# Patient Record
Sex: Male | Born: 1975 | Hispanic: No | Marital: Single | Smoking: Never smoker
Health system: Southern US, Community
[De-identification: ages and names within clinical notes are randomized; demographics above are authoritative.]

---

## 1999-06-28 ENCOUNTER — Emergency Department (HOSPITAL_COMMUNITY): Admission: EM | Admit: 1999-06-28 | Discharge: 1999-06-28 | Payer: Self-pay | Admitting: Emergency Medicine

## 2000-10-12 ENCOUNTER — Emergency Department (HOSPITAL_COMMUNITY): Admission: EM | Admit: 2000-10-12 | Discharge: 2000-10-12 | Payer: Self-pay | Admitting: Emergency Medicine

## 2008-11-20 ENCOUNTER — Emergency Department (HOSPITAL_COMMUNITY): Admission: EM | Admit: 2008-11-20 | Discharge: 2008-11-20 | Payer: Self-pay | Admitting: Emergency Medicine

## 2009-04-03 ENCOUNTER — Emergency Department (HOSPITAL_COMMUNITY): Admission: EM | Admit: 2009-04-03 | Discharge: 2009-04-03 | Payer: Self-pay | Admitting: Emergency Medicine

## 2009-08-18 ENCOUNTER — Emergency Department (HOSPITAL_COMMUNITY): Admission: EM | Admit: 2009-08-18 | Discharge: 2009-08-18 | Payer: Self-pay | Admitting: Family Medicine

## 2010-08-13 ENCOUNTER — Inpatient Hospital Stay (INDEPENDENT_AMBULATORY_CARE_PROVIDER_SITE_OTHER)
Admission: RE | Admit: 2010-08-13 | Discharge: 2010-08-13 | Disposition: A | Discharge status: Home / Self Care | Payer: Self-pay | Source: Ambulatory Visit | Attending: Family Medicine | Admitting: Family Medicine

## 2010-08-13 DIAGNOSIS — M76899 Other specified enthesopathies of unspecified lower limb, excluding foot: Secondary | ICD-10-CM

## 2010-08-13 DIAGNOSIS — M62838 Other muscle spasm: Secondary | ICD-10-CM

## 2010-09-01 ENCOUNTER — Inpatient Hospital Stay (INDEPENDENT_AMBULATORY_CARE_PROVIDER_SITE_OTHER)
Admission: RE | Admit: 2010-09-01 | Discharge: 2010-09-01 | Disposition: A | Discharge status: Home / Self Care | Payer: Self-pay | Source: Ambulatory Visit | Attending: Family Medicine | Admitting: Family Medicine

## 2010-09-01 DIAGNOSIS — M545 Low back pain: Secondary | ICD-10-CM

## 2011-06-19 ENCOUNTER — Emergency Department (HOSPITAL_COMMUNITY)
Admission: EM | Admit: 2011-06-19 | Discharge: 2011-06-19 | Disposition: A | Discharge status: Home / Self Care | Payer: Self-pay | Attending: Emergency Medicine | Admitting: Emergency Medicine

## 2011-06-19 ENCOUNTER — Encounter (HOSPITAL_COMMUNITY): Payer: Self-pay | Admitting: *Deleted

## 2011-06-19 ENCOUNTER — Emergency Department (HOSPITAL_COMMUNITY): Payer: Self-pay

## 2011-06-19 DIAGNOSIS — X500XXA Overexertion from strenuous movement or load, initial encounter: Secondary | ICD-10-CM | POA: Insufficient documentation

## 2011-06-19 DIAGNOSIS — S93409A Sprain of unspecified ligament of unspecified ankle, initial encounter: Secondary | ICD-10-CM | POA: Insufficient documentation

## 2011-06-19 DIAGNOSIS — M25579 Pain in unspecified ankle and joints of unspecified foot: Secondary | ICD-10-CM | POA: Insufficient documentation

## 2011-06-19 MED ORDER — IBUPROFEN 200 MG PO TABS
600.0000 mg | ORAL_TABLET | Freq: Once | ORAL | Status: AC
  Administered 2011-06-19: 600 mg via ORAL
  Filled 2011-06-19: qty 3

## 2011-06-19 MED ORDER — HYDROCODONE-ACETAMINOPHEN 5-325 MG PO TABS: 1.0000 | ORAL_TABLET | Freq: Four times a day (QID) | ORAL | Status: AC | PRN

## 2011-06-19 MED ORDER — IBUPROFEN 600 MG PO TABS: 600.0000 mg | ORAL_TABLET | Freq: Three times a day (TID) | ORAL | Status: AC | PRN

## 2011-06-19 MED ORDER — HYDROCODONE-ACETAMINOPHEN 5-325 MG PO TABS
1.0000 | ORAL_TABLET | Freq: Once | ORAL | Status: AC
  Administered 2011-06-19: 1 via ORAL
  Filled 2011-06-19: qty 1

## 2011-06-19 NOTE — ED Provider Notes (Signed)
History     CSN: [id number]  Arrival date & time 06/19/11  2047   First MD Initiated Contact with Patient 06/19/11 2236      Chief Complaint  Patient presents with  . Ankle Pain    R    (Consider location/radiation/quality/duration/timing/severity/associated sxs/prior treatment) HPI Comments: Patient twisted his ankle.  Sunday playing basketball, areas, swollen, and bruised, more on the lateral aspect and medial  Patient is a 36 y.o. male presenting with ankle pain. The history is provided by the patient.  Ankle Pain  The incident occurred 2 days ago. The incident occurred at the gym. The injury mechanism was torsion. The pain is present in the right ankle. The quality of the pain is described as aching. The pain is at a severity of 6/10. The pain is moderate. The pain has been constant since onset. Associated symptoms include inability to bear weight. Pertinent negatives include no numbness, no loss of motion, no muscle weakness, no loss of sensation and no tingling. The symptoms are aggravated by activity. He has tried NSAIDs for the symptoms.    History reviewed. No pertinent past medical history.  History reviewed. No pertinent past surgical history.  No family history on file.  History  Substance Use Topics  . Smoking status: Never Smoker   . Smokeless tobacco: Not on file  . Alcohol Use: Yes      Review of Systems  Constitutional: Negative for fever.  Musculoskeletal: Positive for joint swelling and gait problem.  Neurological: Negative for tingling, weakness and numbness.    Allergies  Review of patient's allergies indicates no known allergies.  Home Medications   Current Outpatient Rx  Name Route Sig Dispense Refill  . HYDROCODONE-ACETAMINOPHEN 5-325 MG PO TABS Oral Take 1 tablet by mouth every 6 (six) hours as needed for pain. 20 tablet 0  . IBUPROFEN 600 MG PO TABS Oral Take 1 tablet (600 mg total) by mouth every 8 (eight) hours as needed for pain. 30  tablet 0    BP 124/79  Pulse 89  Temp(Src) 98.3 F (36.8 C) (Oral)  Resp 18  SpO2 96%  Physical Exam  Constitutional: He appears well-developed and well-nourished.  HENT:  Head: Normocephalic.  Eyes: Pupils are equal, round, and reactive to light.  Neck: Normal range of motion.  Cardiovascular: Normal rate.   Pulmonary/Chest: Effort normal.  Musculoskeletal: He exhibits edema and tenderness.       Feet:    ED Course  Procedures (including critical care time)  Labs Reviewed - No data to display Dg Ankle Complete Right  06/19/2011  *RADIOLOGY REPORT*  Clinical Data: Ankle inversion.  Pain and instability.  Soft tissue swelling.  RIGHT ANKLE - COMPLETE 3+ VIEW  Comparison: None.  Findings: Soft tissue swelling noted overlying the malleoli, especially laterally.  A well corticated appearing 6 mm density projects below the medial malleolus and probably represents a secondary ossification center.  Plantar calcaneal spur noted.  Talar neck spurring noted dorsally along with dorsal talonavicular spurring.  The ankle joint effusion is present on the lateral projection.  No acute fracture is observed.  IMPRESSION:  1.  No acute fracture identified.  However, there is considerable soft tissue swelling overlying the malleoli, lateral greater than medial. 2.  Spurring of the talus. 3.  Plantar calcaneal spur. 4.  Secondary ossification center noted below the medial malleolus.  Original Report Authenticated By: Dellia Cloud, M.D.     No diagnosis found.    MDM  Ankle sprain, will supply, ASO.  Patient has is on crutches.  I advised elevation, and heat at this time.  We'll discharge with rehabilitation exercises        Arman Filter, NP 06/19/11 2308  Arman Filter, NP 06/19/11 [phone]

## 2011-06-19 NOTE — ED Notes (Signed)
Twisted R ankle Sunday evening. R ankle pain gradually progressively worse. Swelling obvious. Pt reports bruising. No meds PTA.

## 2011-06-19 NOTE — Progress Notes (Signed)
Orthopedic Tech Progress Note Patient Details:  Dustin Coleman 26-Dec-1975 [id number]  Other Ortho Devices Type of Ortho Device: ASO Ortho Device Location: (R) LE Ortho Device Interventions: Application   Jennye Moccasin 06/19/2011, 11:06 PM

## 2011-06-19 NOTE — ED Notes (Signed)
Pt c/o (R) ankle pain d/t "rolling" his ankle while playing basketball on Sunday. Pt reports using OTC Ibuprofen, crutches for assistance w/ambulation, and ice to area. Palpable pulses, + sensation, able to wiggle his toes

## 2011-06-22 NOTE — ED Provider Notes (Signed)
Medical screening examination/treatment/procedure(s) were performed by non-physician practitioner and as supervising physician I was immediately available for consultation/collaboration.   Leigh-Ann Agnieszka Newhouse, MD 06/22/11 0139 

## 2012-08-28 ENCOUNTER — Emergency Department (HOSPITAL_COMMUNITY)
Admission: EM | Admit: 2012-08-28 | Discharge: 2012-08-28 | Disposition: A | Discharge status: Home / Self Care | Payer: No Typology Code available for payment source | Attending: Emergency Medicine | Admitting: Emergency Medicine

## 2012-08-28 ENCOUNTER — Encounter (HOSPITAL_COMMUNITY): Payer: Self-pay | Admitting: *Deleted

## 2012-08-28 DIAGNOSIS — Y9389 Activity, other specified: Secondary | ICD-10-CM | POA: Insufficient documentation

## 2012-08-28 DIAGNOSIS — IMO0002 Reserved for concepts with insufficient information to code with codable children: Secondary | ICD-10-CM | POA: Insufficient documentation

## 2012-08-28 DIAGNOSIS — M545 Low back pain: Secondary | ICD-10-CM

## 2012-08-28 DIAGNOSIS — Y92410 Unspecified street and highway as the place of occurrence of the external cause: Secondary | ICD-10-CM | POA: Insufficient documentation

## 2012-08-28 MED ORDER — CYCLOBENZAPRINE HCL 10 MG PO TABS: 10.0000 mg | ORAL_TABLET | Freq: Three times a day (TID) | ORAL | Status: DC | PRN
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
## 2012-08-29 NOTE — ED Provider Notes (Signed)
Medical screening examination/treatment/procedure(s) were performed by non-physician practitioner and as supervising physician I was immediately available for consultation/collaboration.  Sunnie Nielsen, MD 08/29/12 [phone]

## 2013-07-19 ENCOUNTER — Encounter (HOSPITAL_COMMUNITY): Payer: Self-pay | Admitting: Emergency Medicine

## 2013-07-19 ENCOUNTER — Emergency Department (INDEPENDENT_AMBULATORY_CARE_PROVIDER_SITE_OTHER)
Admission: EM | Admit: 2013-07-19 | Discharge: 2013-07-19 | Disposition: A | Discharge status: Home / Self Care | Payer: Self-pay | Source: Home / Self Care | Attending: Emergency Medicine | Admitting: Emergency Medicine

## 2013-07-19 DIAGNOSIS — L738 Other specified follicular disorders: Secondary | ICD-10-CM

## 2013-07-19 DIAGNOSIS — L678 Other hair color and hair shaft abnormalities: Secondary | ICD-10-CM

## 2013-07-19 DIAGNOSIS — L739 Follicular disorder, unspecified: Secondary | ICD-10-CM

## 2013-07-19 MED ORDER — BACITRACIN 500 UNIT/GM EX OINT: 1.0000 "application " | TOPICAL_OINTMENT | Freq: Two times a day (BID) | CUTANEOUS | Status: DC

## 2013-07-19 NOTE — ED Notes (Addendum)
Pt  Reports  He  Noticed  Yesterday  A  Rash  That  Developed    Yesterday  On  Face  And  Arms  And  Chest    He reports  It is  painfull  To  The  Touch             denys  Any  Known  Causative  Agents   Or  Any  New  meds

## 2013-07-19 NOTE — Discharge Instructions (Signed)
Folliculitis  Folliculitis is redness, soreness, and swelling (inflammation) of the hair follicles. This condition can occur anywhere on the body. People with weakened immune systems, diabetes, or obesity have a greater risk of getting folliculitis. CAUSES  Bacterial infection. This is the most common cause.  Fungal infection.  Viral infection.  Contact with certain chemicals, especially oils and tars. Long-term folliculitis can result from bacteria that live in the nostrils. The bacteria may trigger multiple outbreaks of folliculitis over time. SYMPTOMS Folliculitis most commonly occurs on the scalp, thighs, legs, back, buttocks, and areas where hair is shaved frequently. An Rohn sign of folliculitis is a small, white or yellow, pus-filled, itchy lesion (pustule). These lesions appear on a red, inflamed follicle. They are usually less than 0.2 inches (5 mm) wide. When there is an infection of the follicle that goes deeper, it becomes a boil or furuncle. A group of closely packed boils creates a larger lesion (carbuncle). Carbuncles tend to occur in hairy, sweaty areas of the body. DIAGNOSIS  Your caregiver can usually tell what is wrong by doing a physical exam. A sample may be taken from one of the lesions and tested in a lab. This can help determine what is causing your folliculitis. TREATMENT  Treatment may include:  Applying warm compresses to the affected areas.  Taking antibiotic medicines orally or applying them to the skin.  Draining the lesions if they contain a large amount of pus or fluid.  Laser hair removal for cases of long-lasting folliculitis. This helps to prevent regrowth of the hair. HOME CARE INSTRUCTIONS  Apply warm compresses to the affected areas as directed by your caregiver.  If antibiotics are prescribed, take them as directed. Finish them even if you start to feel better.  You may take over-the-counter medicines to relieve itching.  Do not shave  irritated skin.  Follow up with your caregiver as directed. SEEK IMMEDIATE MEDICAL CARE IF:   You have increasing redness, swelling, or pain in the affected area.  You have a fever. MAKE SURE YOU:  Understand these instructions.  Will watch your condition.  Will get help right away if you are not doing well or get worse. Document Released: 04/02/2001 Document Revised: 07/24/2011 Document Reviewed: 04/24/2011 [employer] Patient Information 2014 [employer].  

## 2013-07-19 NOTE — ED Provider Notes (Signed)
Medical screening examination/treatment/procedure(s) were performed by non-physician practitioner and as supervising physician I was immediately available for consultation/collaboration.  Leslee Homeavid Royer Cristobal, M.D.  Reuben Likesavid C Rafan Sanders, MD 07/19/13 [phone]

## 2013-07-19 NOTE — ED Provider Notes (Signed)
CSN: [id number]     Arrival date & time 07/19/13  1149 History   First MD Initiated Contact with Patient 07/19/13 1254     Chief Complaint  Patient presents with  . Rash   (Consider location/radiation/quality/duration/timing/severity/associated sxs/prior Treatment) HPI Comments: Reports works in [employer] and spent much of the day outdoor yesterday and noticed a few small scattered red "bumps" on forehead, right axilla, left ankle and right forearm. States he was concerned he might have shingles. Reports himself to be new to [place] having recently moved from [place]. No PCP. No reported health issues. States lesions will occasionally itch, but are otherwise without symptom.   Patient is a 38 y.o. male presenting with rash. The history is provided by the patient.  Rash   History reviewed. No pertinent past medical history. History reviewed. No pertinent past surgical history. No family history on file. History  Substance Use Topics  . Smoking status: Never Smoker   . Smokeless tobacco: Not on file  . Alcohol Use: Yes    Review of Systems  Skin: Positive for rash.  All other systems reviewed and are negative.   Allergies  Review of patient's allergies indicates no known allergies.  Home Medications   Prior to Admission medications   Medication Sig Start Date End Date Taking? Authorizing Provider  bacitracin 500 UNIT/GM ointment Apply 1 application topically 2 (two) times daily. To affected areas as needed 07/19/13   Ardis RowanJennifer Lee Presson, PA  cyclobenzaprine (FLEXERIL) 10 MG tablet Take 1 tablet (10 mg total) by mouth 3 (three) times daily as needed for muscle spasms. 08/28/12   Trixie DredgeEmily West, PA-C   BP 119/81  Pulse 61  Temp(Src) 98.5 F (36.9 C) (Oral)  Resp 16  SpO2 99% Physical Exam  Nursing note and vitals reviewed. Constitutional: He is oriented to person, place, and time. He appears well-developed and well-nourished. No distress.  HENT:  Head: Normocephalic  and atraumatic.  Mouth/Throat: Oropharynx is clear and moist.  Eyes: Conjunctivae are normal. No scleral icterus.  Cardiovascular: Normal rate.   Pulmonary/Chest: Effort normal.  Musculoskeletal: Normal range of motion.  Neurological: He is alert and oriented to person, place, and time.  Skin: Skin is warm and dry.  Few small scattered lesions consistent with folliculitis at right axilla, and left lower extremity  Psychiatric: He has a normal mood and affect. His behavior is normal.    ED Course  Procedures (including critical care time) Labs Review Labs Reviewed - No data to display  Imaging Review No results found.   MDM   1. Folliculitis    Advised patient that his exam does not suggest that he has shingles. Bacitracin as needed for very minor folliculitis. Follow up prn.    Ardis RowanJennifer Lee Presson, PA 07/19/13 1351

## 2014-03-04 IMAGING — CR DG ANKLE COMPLETE 3+V*R*
4 series · 4 of 4 positions shown · non-contrast
Comparison: None.

CLINICAL DATA: Ankle inversion.  Pain and instability.  Soft tissue
swelling.

RIGHT ANKLE - COMPLETE 3+ VIEW

[x ankle ap right (1 of 2)]
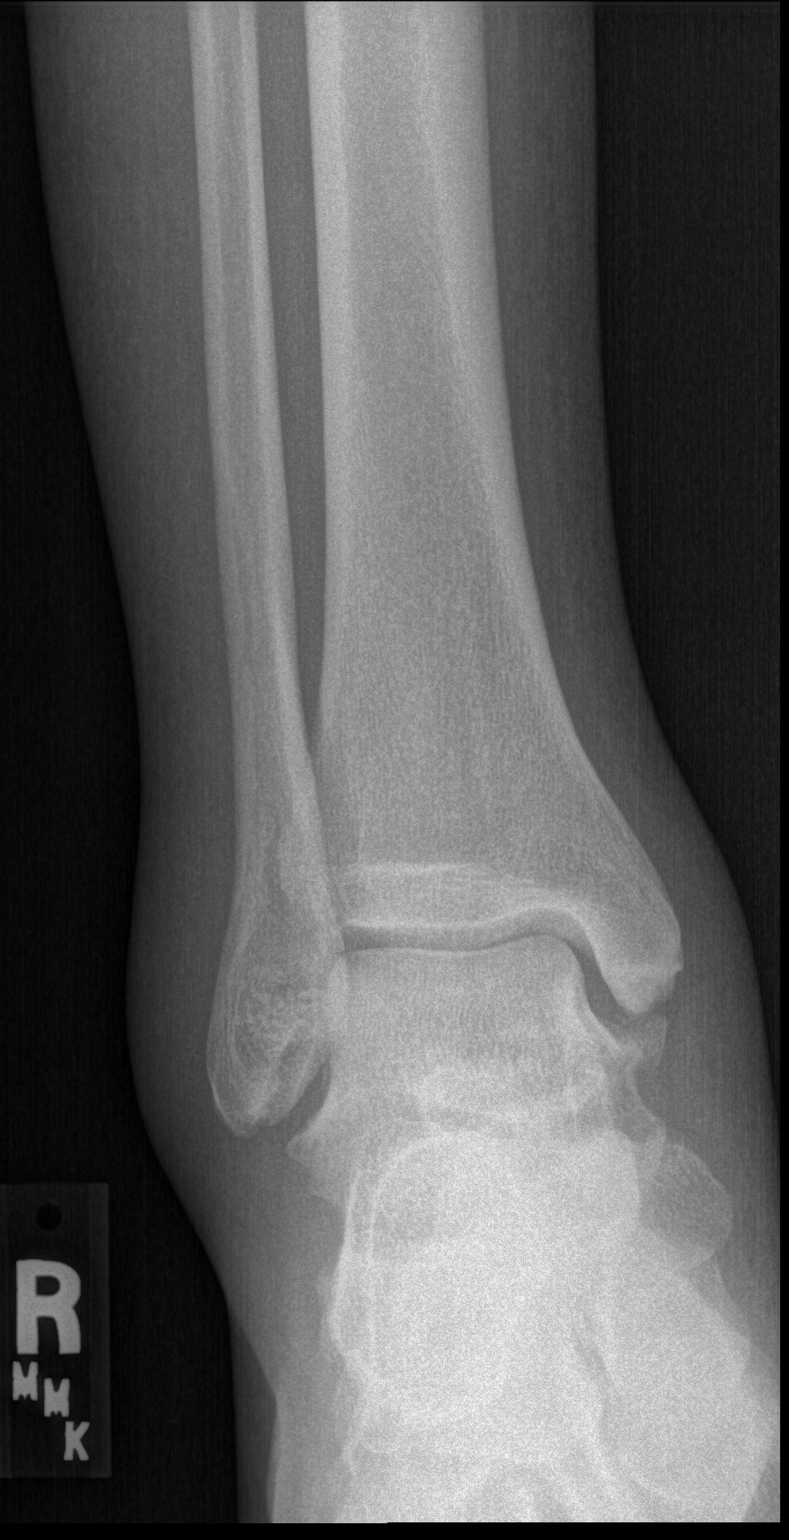

[x ankle obl right]
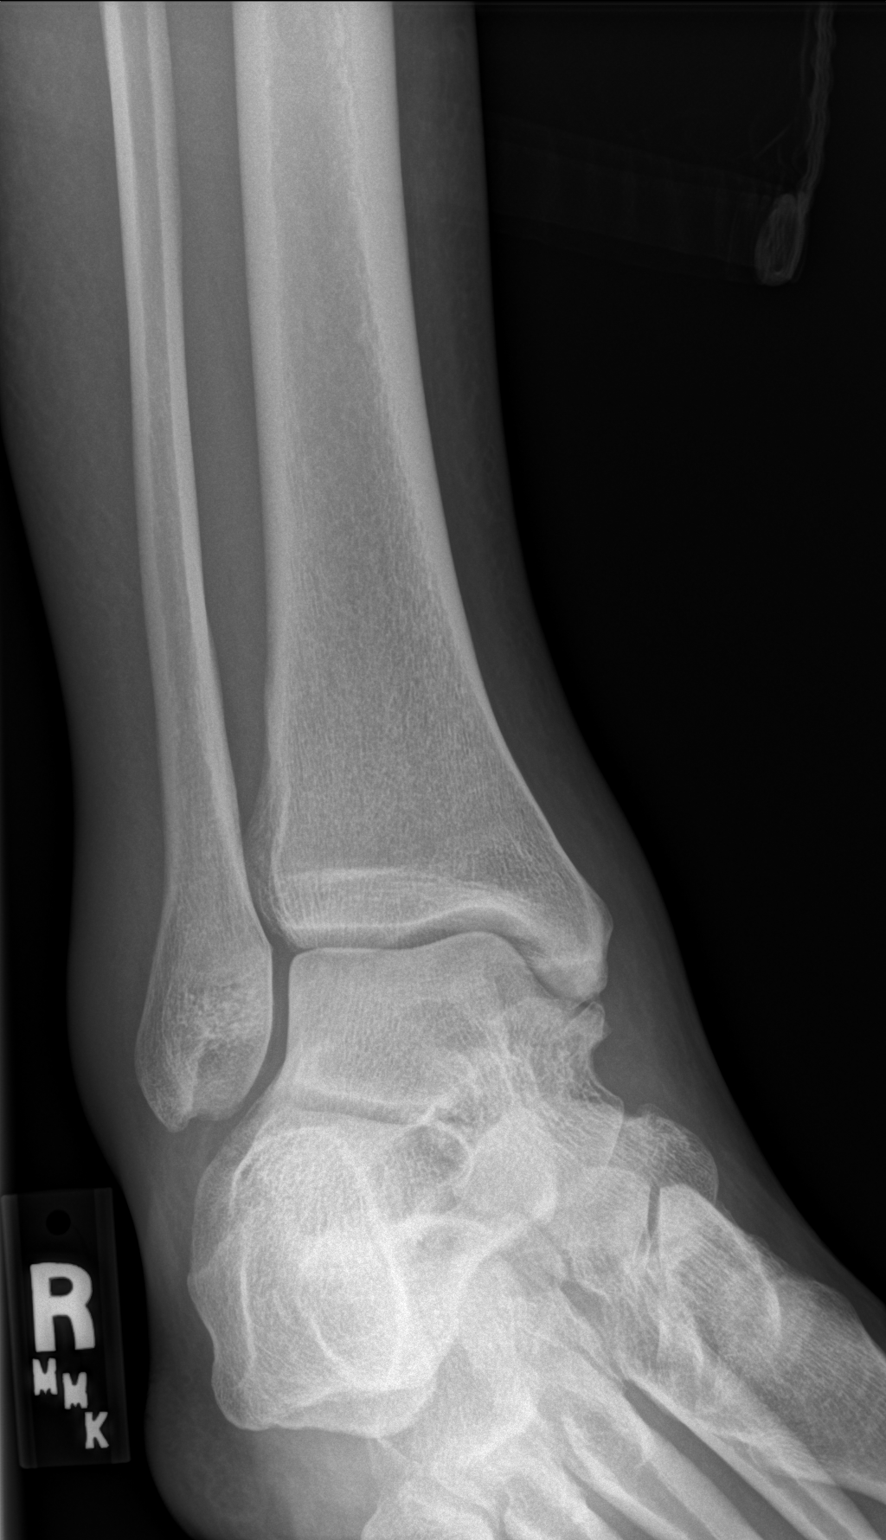

[x ankle lat right]
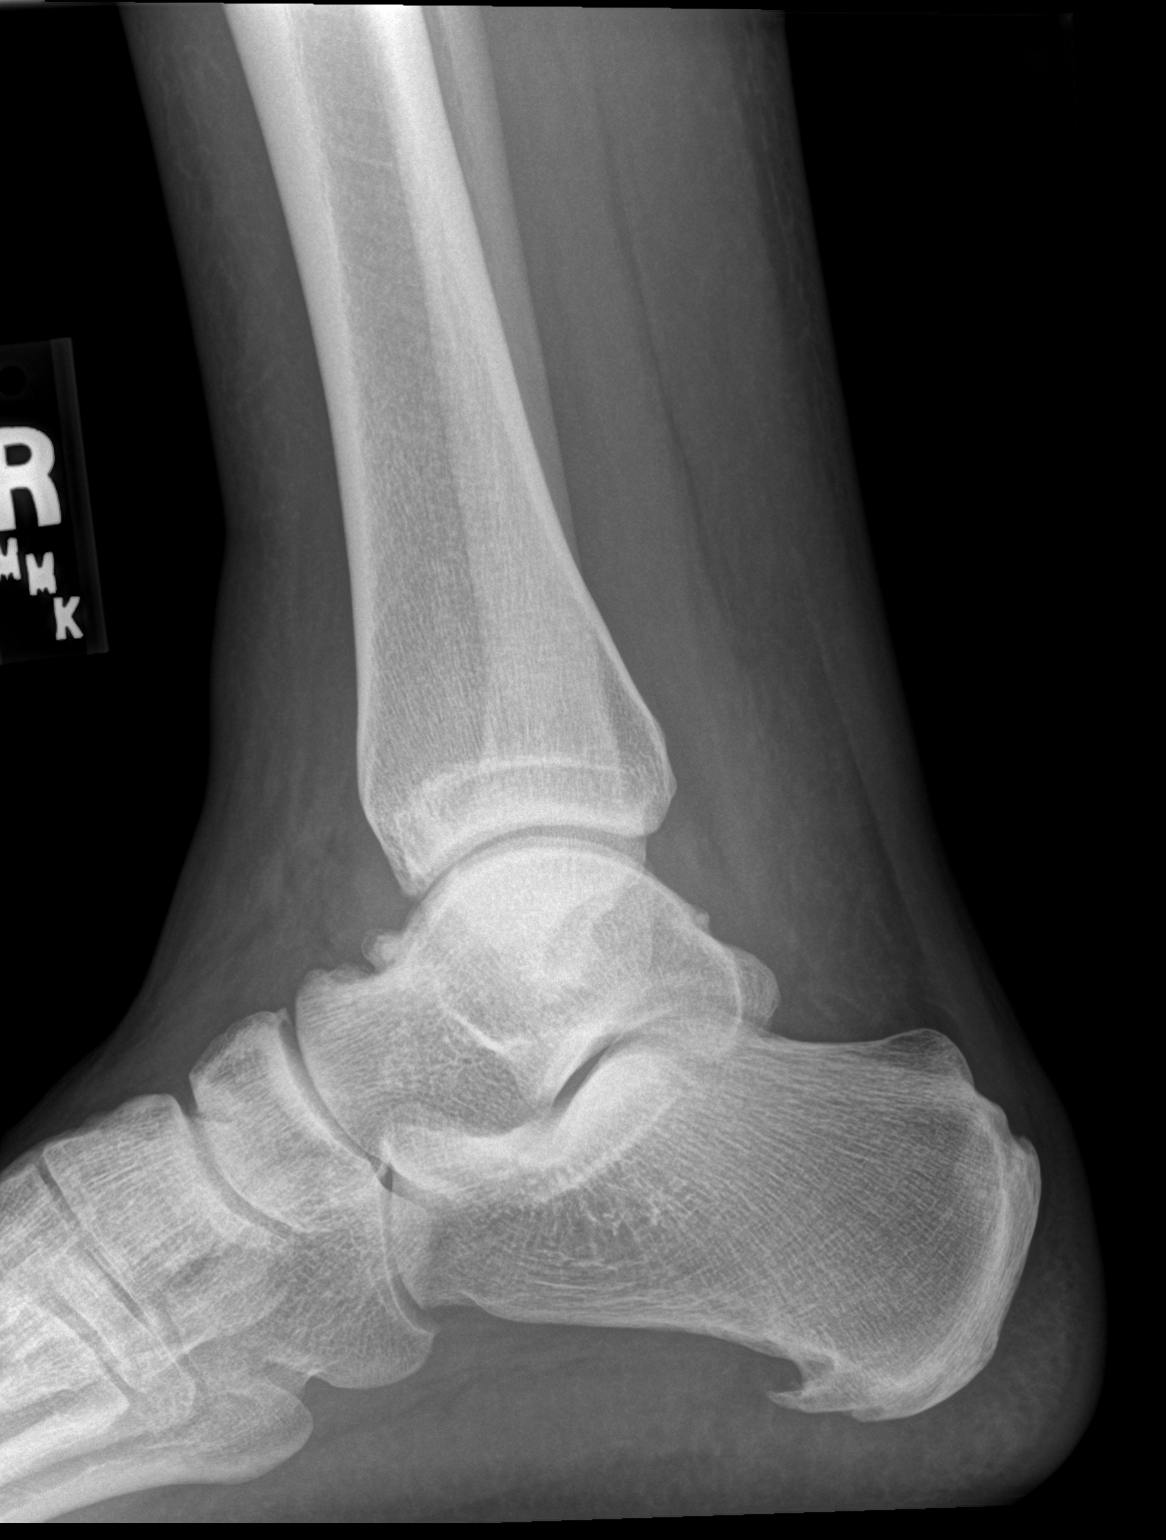

[x ankle ap right (2 of 2)]
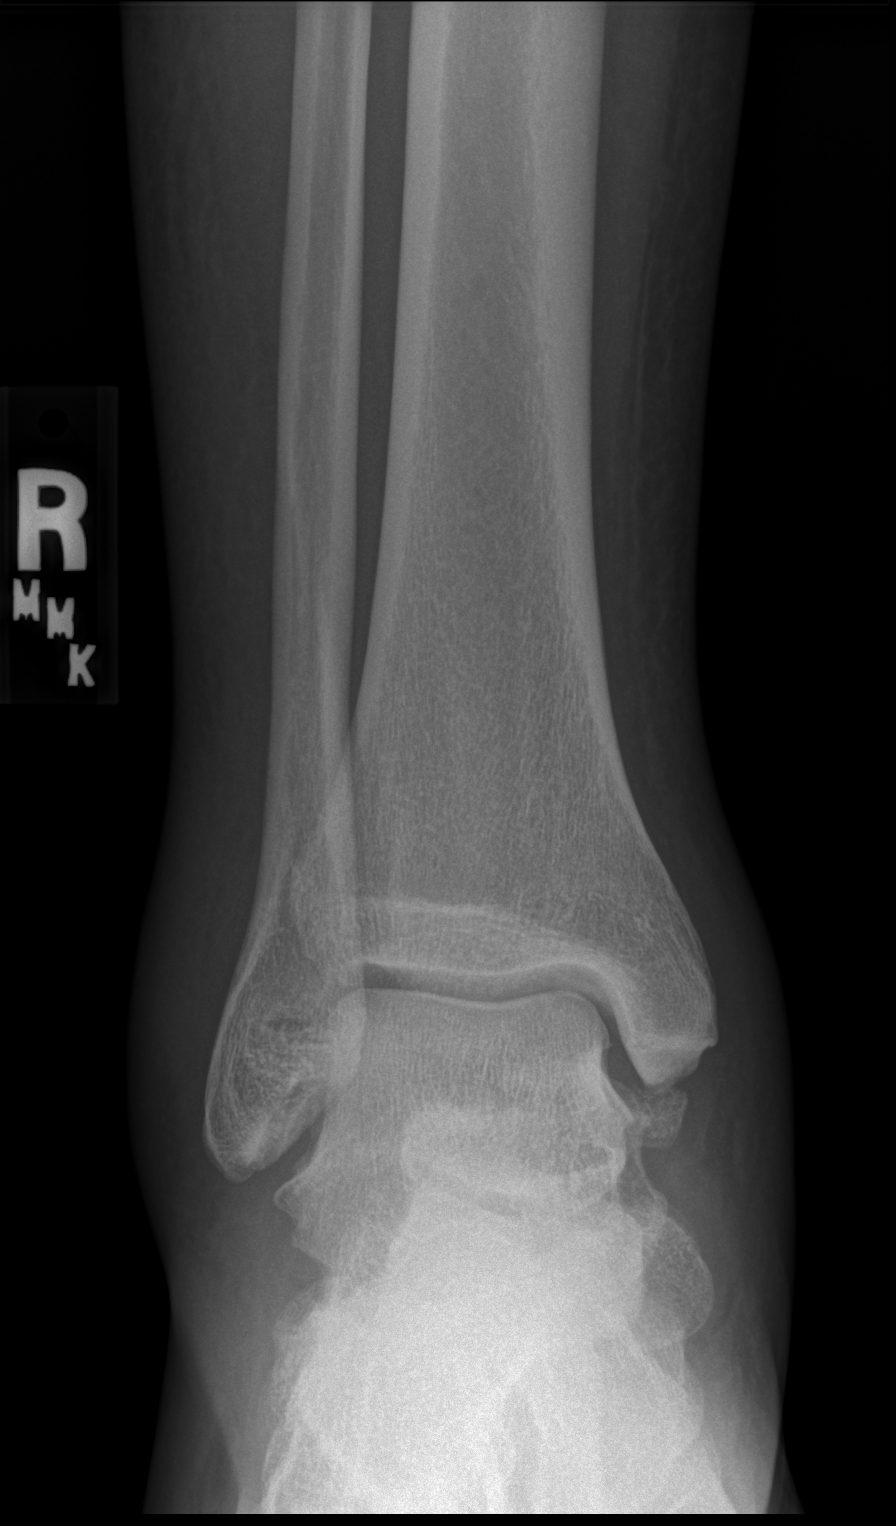

[4 of 4 positions shown; findings below may reference images not displayed]

FINDINGS: Soft tissue swelling noted overlying the malleoli,
especially laterally.  A well corticated appearing 6 mm density
projects below the medial malleolus and probably represents a
secondary ossification center.

Plantar calcaneal spur noted.  Talar neck spurring noted dorsally
along with dorsal talonavicular spurring.

The ankle joint effusion is present on the lateral projection.

No acute fracture is observed.
IMPRESSION: 1.  No acute fracture identified.  However, there is considerable
soft tissue swelling overlying the malleoli, lateral greater than
medial.
2.  Spurring of the talus.
3.  Plantar calcaneal spur.
4.  Secondary ossification center noted below the medial malleolus.

## 2014-12-10 ENCOUNTER — Emergency Department (HOSPITAL_COMMUNITY)
Admission: EM | Admit: 2014-12-10 | Discharge: 2014-12-10 | Disposition: A | Discharge status: Home / Self Care | Payer: No Typology Code available for payment source | Attending: Emergency Medicine | Admitting: Emergency Medicine

## 2014-12-10 ENCOUNTER — Encounter (HOSPITAL_COMMUNITY): Payer: Self-pay | Admitting: Oncology

## 2014-12-10 DIAGNOSIS — M5441 Lumbago with sciatica, right side: Secondary | ICD-10-CM | POA: Diagnosis not present

## 2014-12-10 DIAGNOSIS — Y92410 Unspecified street and highway as the place of occurrence of the external cause: Secondary | ICD-10-CM | POA: Insufficient documentation

## 2014-12-10 DIAGNOSIS — Y998 Other external cause status: Secondary | ICD-10-CM | POA: Diagnosis not present

## 2014-12-10 DIAGNOSIS — Y9389 Activity, other specified: Secondary | ICD-10-CM | POA: Diagnosis not present

## 2014-12-10 DIAGNOSIS — S99921A Unspecified injury of right foot, initial encounter: Secondary | ICD-10-CM | POA: Diagnosis not present

## 2014-12-10 DIAGNOSIS — S199XXA Unspecified injury of neck, initial encounter: Secondary | ICD-10-CM | POA: Diagnosis not present

## 2014-12-10 DIAGNOSIS — M542 Cervicalgia: Secondary | ICD-10-CM

## 2014-12-10 DIAGNOSIS — S3992XA Unspecified injury of lower back, initial encounter: Secondary | ICD-10-CM | POA: Diagnosis present

## 2014-12-10 MED ORDER — CYCLOBENZAPRINE HCL 10 MG PO TABS: 10.0000 mg | ORAL_TABLET | Freq: Two times a day (BID) | ORAL | Status: DC | PRN

## 2014-12-10 MED ORDER — IBUPROFEN 800 MG PO TABS: 800.0000 mg | ORAL_TABLET | Freq: Three times a day (TID) | ORAL | Status: AC

## 2014-12-10 MED ORDER — IBUPROFEN 800 MG PO TABS
800.0000 mg | ORAL_TABLET | Freq: Once | ORAL | Status: AC
  Administered 2014-12-10: 800 mg via ORAL
  Filled 2014-12-10: qty 1

## 2014-12-10 MED ORDER — TRAMADOL HCL 50 MG PO TABS: 50.0000 mg | ORAL_TABLET | Freq: Four times a day (QID) | ORAL | Status: DC | PRN

## 2014-12-10 NOTE — Discharge Instructions (Signed)
Sciatica °Sciatica is pain, weakness, numbness, or tingling along the path of the sciatic nerve. The nerve starts in the lower back and runs down the back of each leg. The nerve controls the muscles in the lower leg and in the back of the knee, while also providing sensation to the back of the thigh, lower leg, and the sole of your foot. Sciatica is a symptom of another medical condition. For instance, nerve damage or certain conditions, such as a herniated disk or bone spur on the spine, pinch or put pressure on the sciatic nerve. This causes the pain, weakness, or other sensations normally associated with sciatica. Generally, sciatica only affects one side of the body. °CAUSES  °· Herniated or slipped disc. °· Degenerative disk disease. °· A pain disorder involving the narrow muscle in the buttocks (piriformis syndrome). °· Pelvic injury or fracture. °· Pregnancy. °· Tumor (rare). °SYMPTOMS  °Symptoms can vary from mild to very severe. The symptoms usually travel from the low back to the buttocks and down the back of the leg. Symptoms can include: °· Mild tingling or dull aches in the lower back, leg, or hip. °· Numbness in the back of the calf or sole of the foot. °· Burning sensations in the lower back, leg, or hip. °· Sharp pains in the lower back, leg, or hip. °· Leg weakness. °· Severe back pain inhibiting movement. °These symptoms may get worse with coughing, sneezing, laughing, or prolonged sitting or standing. Also, being overweight may worsen symptoms. °DIAGNOSIS  °Your caregiver will perform a physical exam to look for common symptoms of sciatica. He or she may ask you to do certain movements or activities that would trigger sciatic nerve pain. Other tests may be performed to find the cause of the sciatica. These may include: °· Blood tests. °· X-rays. °· Imaging tests, such as an MRI or CT scan. °TREATMENT  °Treatment is directed at the cause of the sciatic pain. Sometimes, treatment is not necessary  and the pain and discomfort goes away on its own. If treatment is needed, your caregiver may suggest: °· Over-the-counter medicines to relieve pain. °· Prescription medicines, such as anti-inflammatory medicine, muscle relaxants, or narcotics. °· Applying heat or ice to the painful area. °· Steroid injections to lessen pain, irritation, and inflammation around the nerve. °· Reducing activity during periods of pain. °· Exercising and stretching to strengthen your abdomen and improve flexibility of your spine. Your caregiver may suggest losing weight if the extra weight makes the back pain worse. °· Physical therapy. °· Surgery to eliminate what is pressing or pinching the nerve, such as a bone spur or part of a herniated disk. °HOME CARE INSTRUCTIONS  °· Only take over-the-counter or prescription medicines for pain or discomfort as directed by your caregiver. °· Apply ice to the affected area for 20 minutes, 3-4 times a day for the first 48-72 hours. Then try heat in the same way. °· Exercise, stretch, or perform your usual activities if these do not aggravate your pain. °· Attend physical therapy sessions as directed by your caregiver. °· Keep all follow-up appointments as directed by your caregiver. °· Do not wear high heels or shoes that do not provide proper support. °· Check your mattress to see if it is too soft. A firm mattress may lessen your pain and discomfort. °SEEK IMMEDIATE MEDICAL CARE IF:  °· You lose control of your bowel or bladder (incontinence). °· You have increasing weakness in the lower back, pelvis, buttocks,   or legs.  You have redness or swelling of your back.  You have a burning sensation when you urinate.  You have pain that gets worse when you lie down or awakens you at night.  Your pain is worse than you have experienced in the past.  Your pain is lasting longer than 4 weeks.  You are suddenly losing weight without reason. MAKE SURE YOU:  Understand these  instructions.  Will watch your condition.  Will get help right away if you are not doing well or get worse.   This information is not intended to replace advice given to you by your health care provider. Make sure you discuss any questions you have with your health care provider.   Document Released: 01/16/2001 Document Revised: 10/13/2014 Document Reviewed: 06/03/2011 Elsevier Interactive Patient Education 2016 Else[employer] Inc. rLumbosacral Strain Lumbosacral strain is a strain of any of the parts that make up your lumbosacral vertebrae. Your lumbosacral vertebrae are the bones that make up the lower third of your backbone. Your lumbosacral vertebrae are held together by muscles and tough, fibrous tissue (ligaments).  CAUSES  A sudden blow to your back can cause lumbosacral strain. Also, anything that causes an excessive stretch of the muscles in the low back can cause this strain. This is typically seen when people exert themselves strenuously, fall, lift heavy objects, bend, or crouch repeatedly. RISK FACTORS  Physically demanding work.  Participation in pushing or pulling sports or sports that require a sudden twist of the back (tennis, golf, baseball).  Weight lifting.  Excessive lower back curvature.  Forward-tilted pelvis.  Weak back or abdominal muscles or both.  Tight hamstrings. SIGNS AND SYMPTOMS  Lumbosacral strain may cause pain in the area of your injury or pain that moves (radiates) down your leg.  DIAGNOSIS Your health care provider can often diagnose lumbosacral strain through a physical exam. In some cases, you may need tests such as X-ray exams.  TREATMENT  Treatment for your lower back injury depends on many factors that your clinician will have to evaluate. However, most treatment will include the use of anti-inflammatory medicines. HOME CARE INSTRUCTIONS   Avoid hard physical activities (tennis, racquetball, waterskiing) if you are not in proper physical  condition for it. This may aggravate or create problems.  If you have a back problem, avoid sports requiring sudden body movements. Swimming and walking are generally safer activities.  Maintain good posture.  Maintain a healthy weight.  For acute conditions, you may put ice on the injured area.  Put ice in a plastic bag.  Place a towel between your skin and the bag.  Leave the ice on for 20 minutes, 2-3 times a day.  When the low back starts healing, stretching and strengthening exercises may be recommended. SEEK MEDICAL CARE IF:  Your back pain is getting worse.  You experience severe back pain not relieved with medicines. SEEK IMMEDIATE MEDICAL CARE IF:   You have numbness, tingling, weakness, or problems with the use of your arms or legs.  There is a change in bowel or bladder control.  You have increasing pain in any area of the body, including your belly (abdomen).  You notice shortness of breath, dizziness, or feel faint.  You feel sick to your stomach (nauseous), are throwing up (vomiting), or become sweaty.  You notice discoloration of your toes or legs, or your feet get very cold. MAKE SURE YOU:   Understand these instructions.  Will watch your condition.  Will get  help right away if you are not doing well or get worse.   This information is not intended to replace advice given to you by your health care provider. Make sure you discuss any questions you have with your health care provider.   Document Released: 11/01/2004 Document Revised: 02/12/2014 Document Reviewed: 09/10/2012 Elsevier Interactive Patient Education 2016 [employer]  Tourist information centre managerMotor Vehicle Collision It is common to have multiple bruises and sore muscles after a motor vehicle collision (MVC). These tend to feel worse for the first 24 hours. You may have the most stiffness and soreness over the first several hours. You may also feel worse when you wake up the first morning after your collision.  After this point, you will usually begin to improve with each day. The speed of improvement often depends on the severity of the collision, the number of injuries, and the location and nature of these injuries. HOME CARE INSTRUCTIONS  Put ice on the injured area.  Put ice in a plastic bag.  Place a towel between your skin and the bag.  Leave the ice on for 15-20 minutes, 3-4 times a day, or as directed by your health care provider.  Drink enough fluids to keep your urine clear or pale yellow. Do not drink alcohol.  Take a warm shower or bath once or twice a day. This will increase blood flow to sore muscles.  You may return to activities as directed by your caregiver. Be careful when lifting, as this may aggravate neck or back pain.  Only take over-the-counter or prescription medicines for pain, discomfort, or fever as directed by your caregiver. Do not use aspirin. This may increase bruising and bleeding. SEEK IMMEDIATE MEDICAL CARE IF:  You have numbness, tingling, or weakness in the arms or legs.  You develop severe headaches not relieved with medicine.  You have severe neck pain, especially tenderness in the middle of the back of your neck.  You have changes in bowel or bladder control.  There is increasing pain in any area of the body.  You have shortness of breath, light-headedness, dizziness, or fainting.  You have chest pain.  You feel sick to your stomach (nauseous), throw up (vomit), or sweat.  You have increasing abdominal discomfort.  There is blood in your urine, stool, or vomit.  You have pain in your shoulder (shoulder strap areas).  You feel your symptoms are getting worse. MAKE SURE YOU:  Understand these instructions.  Will watch your condition.  Will get help right away if you are not doing well or get worse.   This information is not intended to replace advice given to you by your health care provider. Make sure you discuss any questions you have  with your health care provider.   Document Released: 01/22/2005 Document Revised: 02/12/2014 Document Reviewed: 06/21/2010 Elsevier Interactive Patient Education [employer] Elsevier Inc.
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
## 2015-07-24 ENCOUNTER — Emergency Department (HOSPITAL_COMMUNITY): Payer: No Typology Code available for payment source

## 2015-07-24 ENCOUNTER — Emergency Department (HOSPITAL_COMMUNITY)
Admission: EM | Admit: 2015-07-24 | Discharge: 2015-07-24 | Disposition: A | Discharge status: Home / Self Care | Payer: No Typology Code available for payment source | Attending: Emergency Medicine | Admitting: Emergency Medicine

## 2015-07-24 ENCOUNTER — Encounter (HOSPITAL_COMMUNITY): Payer: Self-pay

## 2015-07-24 DIAGNOSIS — W230XXA Caught, crushed, jammed, or pinched between moving objects, initial encounter: Secondary | ICD-10-CM | POA: Insufficient documentation

## 2015-07-24 DIAGNOSIS — Y999 Unspecified external cause status: Secondary | ICD-10-CM | POA: Insufficient documentation

## 2015-07-24 DIAGNOSIS — S6000XA Contusion of unspecified finger without damage to nail, initial encounter: Secondary | ICD-10-CM

## 2015-07-24 DIAGNOSIS — Y9367 Activity, basketball: Secondary | ICD-10-CM | POA: Insufficient documentation

## 2015-07-24 DIAGNOSIS — S60022A Contusion of left index finger without damage to nail, initial encounter: Secondary | ICD-10-CM | POA: Insufficient documentation

## 2015-07-24 DIAGNOSIS — Y92310 Basketball court as the place of occurrence of the external cause: Secondary | ICD-10-CM | POA: Insufficient documentation

## 2015-07-24 MED ORDER — NAPROXEN 500 MG PO TABS: 500.0000 mg | ORAL_TABLET | Freq: Two times a day (BID) | ORAL | Status: DC

## 2015-07-24 NOTE — ED Notes (Signed)
Patient transported to X-ray 

## 2015-07-24 NOTE — Discharge Instructions (Signed)
X-rays negative for any fractures. Follow-up with hand surgery if finger is not showing good range of motion over the next 7 days. Take Naprosyn as needed for the inflammation.

## 2015-07-24 NOTE — ED Notes (Signed)
Patient here with ongoing left index finger pain x 2 weeks after jamming same while playing basketball

## 2015-07-24 NOTE — ED Provider Notes (Signed)
CSN: [id number]     Arrival date & time 07/24/15  0902 History   First MD Initiated Contact with Patient 07/24/15 [phone]     Chief Complaint  Patient presents with  . Finger Injury     (Consider location/radiation/quality/duration/timing/severity/associated sxs/prior Treatment) The history is provided by the patient.  40 year old male status post injury to left index finger 2 weeks ago while playing basketball. Patient still with swelling at the PIP joint and limited range of motion. Patient's concerned about fracture. No other complaints.  History reviewed. No pertinent past medical history. History reviewed. No pertinent past surgical history. No family history on file. Social History  Substance Use Topics  . Smoking status: Never Smoker   . Smokeless tobacco: Never Used  . Alcohol Use: Yes    Review of Systems  Constitutional: Negative for fever.  HENT: Negative for congestion.   Respiratory: Negative for shortness of breath.   Cardiovascular: Negative for chest pain.  Gastrointestinal: Negative for abdominal pain.  Musculoskeletal: Positive for joint swelling.  Skin: Negative for wound.  Hematological: Does not bruise/bleed easily.  Psychiatric/Behavioral: Negative for confusion.      Allergies  Review of patient's allergies indicates no known allergies.  Home Medications   Prior to Admission medications   Medication Sig Start Date End Date Taking? Authorizing Provider  cyclobenzaprine (FLEXERIL) 10 MG tablet Take 1 tablet (10 mg total) by mouth 2 (two) times daily as needed for muscle spasms. 12/10/14   Danelle Berry, PA-C  ibuprofen (ADVIL,MOTRIN) 800 MG tablet Take 1 tablet (800 mg total) by mouth 3 (three) times daily. 12/10/14   Danelle Berry, PA-C  naproxen (NAPROSYN) 500 MG tablet Take 1 tablet (500 mg total) by mouth 2 (two) times daily. 07/24/15   Vanetta Mulders, MD  traMADol (ULTRAM) 50 MG tablet Take 1 tablet (50 mg total) by mouth every 6 (six) hours as needed.  12/10/14   Danelle Berry, PA-C   BP 116/79 mmHg  Pulse 67  Temp(Src) 97.6 F (36.4 C) (Oral)  Resp 14  SpO2 100% Physical Exam  Constitutional: He is oriented to person, place, and time. He appears well-developed and well-nourished. No distress.  HENT:  Head: Normocephalic and atraumatic.  Eyes: Conjunctivae and EOM are normal. Pupils are equal, round, and reactive to light.  Neck: Normal range of motion. Neck supple.  Cardiovascular: Normal rate and regular rhythm.   No murmur heard. Pulmonary/Chest: Effort normal and breath sounds normal. No respiratory distress.  Abdominal: Soft. Bowel sounds are normal. There is no tenderness.  Musculoskeletal: He exhibits edema and tenderness.  Patient's left index finger with swelling at the PIP joint. Some limitation to flexion at the PIP joint. Good extension. Neurovascularly intact. No evidence of any injuries no evidence of dislocation.  Neurological: He is alert and oriented to person, place, and time. No cranial nerve deficit. He exhibits normal muscle tone. Coordination normal.  Nursing note and vitals reviewed.   ED Course  Procedures (including critical care time) Labs Review Labs Reviewed - No data to display  Imaging Review Dg Hand Complete Left  07/24/2015  CLINICAL DATA:  Left index finger pain for 2 weeks after jamming injury playing basketball. EXAM: LEFT HAND - COMPLETE 3+ VIEW COMPARISON:  None. FINDINGS: There is no evidence of fracture or dislocation. There is no evidence of arthropathy or other focal bone abnormality. Soft tissues are unremarkable. IMPRESSION: Normal left hand. Electronically Signed   By: Lupita Raider, M.D.   On: 07/24/2015 09:55  I have personally reviewed and evaluated these images and lab results as part of my medical decision-making.   EKG Interpretation None      MDM   Final diagnoses:  Contusion of index finger, initial encounter    Patient jammed his left index finger playing basketball  approximately 2 weeks ago. X-ray showed no evidence of any dislocation or bony injury. Patient with reasonable flexion and extension at the PIP joint. Patient reassured will treat with Naprosyn referral to hand surgery provided as needed.    Vanetta MuldersScott Natiya Seelinger, MD 07/24/15 1038

## 2015-07-24 NOTE — ED Notes (Signed)
Back from xray

## 2019-01-27 ENCOUNTER — Encounter (HOSPITAL_COMMUNITY): Payer: Self-pay | Admitting: Emergency Medicine

## 2019-01-27 ENCOUNTER — Emergency Department (HOSPITAL_COMMUNITY)
Admission: EM | Admit: 2019-01-27 | Discharge: 2019-01-27 | Disposition: A | Discharge status: Home / Self Care | Payer: Self-pay | Attending: Emergency Medicine | Admitting: Emergency Medicine

## 2019-01-27 ENCOUNTER — Other Ambulatory Visit: Payer: Self-pay

## 2019-01-27 DIAGNOSIS — K122 Cellulitis and abscess of mouth: Secondary | ICD-10-CM | POA: Insufficient documentation

## 2019-01-27 MED ORDER — DEXAMETHASONE 1 MG/ML PO CONC
10.0000 mg | Freq: Once | ORAL | Status: DC
  Filled 2019-01-27: qty 10

## 2019-01-27 MED ORDER — IBUPROFEN 100 MG/5ML PO SUSP
800.0000 mg | Freq: Once | ORAL | Status: AC
  Administered 2019-01-27: 800 mg via ORAL
  Filled 2019-01-27 (×2): qty 40

## 2019-01-27 MED ORDER — DEXAMETHASONE 4 MG PO TABS
10.0000 mg | ORAL_TABLET | Freq: Once | ORAL | Status: AC
  Administered 2019-01-27: 10:00:00 10 mg via ORAL
  Filled 2019-01-27: qty 3

## 2019-01-27 NOTE — ED Provider Notes (Signed)
[facility] EMERGENCY DEPARTMENT Provider Note   CSN: [id number] Arrival date & time: 01/27/19  9371     History Chief Complaint  Patient presents with  . Sore Throat    Dustin Coleman is a 43 y.o. male.  43 year old male presents with complaint of swollen uvula.  Patient states that he woke up this morning and noticed it did not feel right in his throat with swallowing, looked in the mirror noticed that his uvula was enlarged.  Patient denies sore throat or difficulty breathing, fevers, chills any other sick complaints.  Dustin Coleman was evaluated in Emergency Department on 01/27/2019 for the symptoms described in the history of present illness. He was evaluated in the context of the global COVID-19 pandemic, which necessitated consideration that the patient might be at risk for infection with the SARS-CoV-2 virus that causes COVID-19. Institutional protocols and algorithms that pertain to the evaluation of patients at risk for COVID-19 are in a state of rapid change based on information released by regulatory bodies including the CDC and federal and state organizations. These policies and algorithms were followed during the patient's care in the ED.         History reviewed. No pertinent past medical history.  There are no problems to display for this patient.   History reviewed. No pertinent surgical history.     No family history on file.  Social History   Tobacco Use  . Smoking status: Never Smoker  . Smokeless tobacco: Never Used  Substance Use Topics  . Alcohol use: Yes  . Drug use: Yes    Types: Marijuana    Home Medications Prior to Admission medications   Medication Sig Start Date End Date Taking? Authorizing Provider  cyclobenzaprine (FLEXERIL) 10 MG tablet Take 1 tablet (10 mg total) by mouth 2 (two) times daily as needed for muscle spasms. Patient not taking: Reported on 07/24/2015 12/10/14   Delsa Grana, PA-C  ibuprofen  (ADVIL,MOTRIN) 800 MG tablet Take 1 tablet (800 mg total) by mouth 3 (three) times daily. Patient not taking: Reported on 07/24/2015 12/10/14   Delsa Grana, PA-C  naproxen (NAPROSYN) 500 MG tablet Take 1 tablet (500 mg total) by mouth 2 (two) times daily. 07/24/15   Fredia Sorrow, MD  traMADol (ULTRAM) 50 MG tablet Take 1 tablet (50 mg total) by mouth every 6 (six) hours as needed. Patient not taking: Reported on 07/24/2015 12/10/14   Delsa Grana, PA-C    Allergies    Patient has no known allergies.  Review of Systems   Review of Systems  Constitutional: Negative for fever.  HENT: Negative for congestion, ear pain, rhinorrhea, sinus pressure, sinus pain, sneezing, sore throat, trouble swallowing and voice change.   Respiratory: Negative for cough, shortness of breath, wheezing and stridor.   Skin: Negative for rash and wound.  Allergic/Immunologic: Negative for immunocompromised state.  Neurological: Negative for headaches.  Hematological: Negative for adenopathy.  All other systems reviewed and are negative.   Physical Exam Updated Vital Signs BP (!) 146/86 (BP Location: Right Arm)   Pulse 71   Temp 98.2 F (36.8 C)   Resp 16   SpO2 98%   Physical Exam Vitals and nursing note reviewed.  Constitutional:      General: He is not in acute distress.    Appearance: He is well-developed. He is not diaphoretic.  HENT:     Head: Normocephalic and atraumatic.     Right Ear: Tympanic membrane and ear canal normal.  Left Ear: Tympanic membrane and ear canal normal.     Mouth/Throat:     Mouth: Mucous membranes are moist.     Pharynx: Uvula midline. Posterior oropharyngeal erythema and uvula swelling present. No pharyngeal swelling or oropharyngeal exudate.     Tonsils: No tonsillar exudate or tonsillar abscesses. 1+ on the right. 1+ on the left.  Eyes:     Conjunctiva/sclera: Conjunctivae normal.  Pulmonary:     Effort: Pulmonary effort is normal.  Musculoskeletal:      Cervical back: Neck supple.  Lymphadenopathy:     Cervical: No cervical adenopathy.  Skin:    General: Skin is warm and dry.     Findings: No rash.  Neurological:     Mental Status: He is alert and oriented to person, place, and time.  Psychiatric:        Behavior: Behavior normal.     ED Results / Procedures / Treatments   Labs (all labs ordered are listed, but only abnormal results are displayed) Labs Reviewed - No data to display  EKG None  Radiology No results found.  Procedures Procedures (including critical care time)  Medications Ordered in ED Medications  ibuprofen (ADVIL) 100 MG/5ML suspension 800 mg (800 mg Oral Given 01/27/19 0939)  dexamethasone (DECADRON) tablet 10 mg (10 mg Oral Given 01/27/19 7628)    ED Course  I have reviewed the triage vital signs and the nursing notes.  Pertinent labs & imaging results that were available during my care of the patient were reviewed by me and considered in my medical decision making (see chart for details).  Clinical Course as of Jan 27 939  Tue Jan 27, 2019  3517 43 year old male presents complaint of a swollen uvula onset this morning upon waking, denies any associated sick symptoms.  On exam has erythema and edema of the uvula. No respiratory complaints or concerns, and no change in voice, patient is tolerating secretions without difficulty. Patient will be given dose of Decadron and Motrin, advised home to rest and push hydrating fluids.  Recheck if symptoms persist.   [LM]    Clinical Course User Index [LM] Alden Hipp   MDM Rules/Calculators/A&P                     Final Clinical Impression(s) / ED Diagnoses Final diagnoses:  Uvulitis    Rx / DC Orders ED Discharge Orders    None       Jeannie Fend, PA-C 01/27/19 0940    Tilden Fossa, MD 01/28/19 [id number]

## 2019-01-27 NOTE — Discharge Instructions (Addendum)
You may take liquid children's Motrin or Tylenol for pain as needed. Home to rest, push hydrating fluids today such as Gatorade. Return to the ER or urgent care for fever, worsening symptoms or other concerns. You were given a dose of Decadron today, this is a steroid that should help decrease the swelling as well as increase fluid intake which should also help with your swelling and discomfort.

## 2019-01-27 NOTE — ED Triage Notes (Signed)
Woke up with sore throat this morning.  Denies any other symptoms.

## 2021-12-17 ENCOUNTER — Ambulatory Visit (HOSPITAL_COMMUNITY)
Admission: EM | Admit: 2021-12-17 | Discharge: 2021-12-17 | Disposition: A | Discharge status: Home / Self Care | Payer: Medicaid Other | Attending: Internal Medicine | Admitting: Internal Medicine

## 2021-12-17 ENCOUNTER — Ambulatory Visit (INDEPENDENT_AMBULATORY_CARE_PROVIDER_SITE_OTHER): Payer: Medicaid Other

## 2021-12-17 ENCOUNTER — Encounter (HOSPITAL_COMMUNITY): Payer: Self-pay

## 2021-12-17 DIAGNOSIS — W19XXXA Unspecified fall, initial encounter: Secondary | ICD-10-CM

## 2021-12-17 DIAGNOSIS — R079 Chest pain, unspecified: Secondary | ICD-10-CM

## 2021-12-17 DIAGNOSIS — R0781 Pleurodynia: Secondary | ICD-10-CM | POA: Diagnosis not present

## 2021-12-17 DIAGNOSIS — R0602 Shortness of breath: Secondary | ICD-10-CM | POA: Diagnosis not present

## 2021-12-17 MED ORDER — KETOROLAC TROMETHAMINE 30 MG/ML IJ SOLN
30.0000 mg | Freq: Once | INTRAMUSCULAR | Status: AC
  Administered 2021-12-17: 30 mg via INTRAMUSCULAR

## 2021-12-17 MED ORDER — CYCLOBENZAPRINE HCL 10 MG PO TABS: 10.0000 mg | ORAL_TABLET | Freq: Two times a day (BID) | ORAL | 0 refills | Status: DC | PRN

## 2021-12-17 MED ORDER — KETOROLAC TROMETHAMINE 30 MG/ML IJ SOLN
INTRAMUSCULAR | Status: AC
  Filled 2021-12-17: qty 1

## 2021-12-17 NOTE — ED Provider Notes (Signed)
[facility]    CSN: [id number] Arrival date & time: 12/17/21  1745      History   Chief Complaint Chief Complaint  Patient presents with   Fall   Rib Injury   Shortness of Breath    HPI Dustin Coleman is a 46 y.o. male.   Patient presents urgent care for evaluation of left-sided rib cage pain that started 2 to 3 days ago after he fell on top of the ladder.  Patient was carrying a ladder when he suffered a mechanical fall and tripped and fell landing with his left chest on top of the ladder.  No preceding dizziness, shortness of breath, chest pain, nausea, vomiting, or heart palpitations prior to falling.  He did not hit his head.  Denies lacerations and abrasions.  Pain is to the left inferior nipple and is currently a 9 on a scale of 0-10.  Rib cage pain is worse with deep inspiration, cough, and movement..  Denies a popping or cracking sensation to the chest wall.  Denies shortness of breath, nausea, vomiting, dizziness, vision changes, mouth trauma, and pain to the upper and lower extremities.  Not on blood thinners.  He has not attempted use of any over-the-counter medications prior to arrival at urgent care.   Fall Associated symptoms include shortness of breath.  Shortness of Breath   History reviewed. No pertinent past medical history.  There are no problems to display for this patient.   History reviewed. No pertinent surgical history.     Home Medications    Prior to Admission medications   Medication Sig Start Date End Date Taking? Authorizing Provider  cyclobenzaprine (FLEXERIL) 10 MG tablet Take 1 tablet (10 mg total) by mouth 2 (two) times daily as needed for muscle spasms. 12/17/21  Yes Carlisle Beers, FNP  ibuprofen (ADVIL,MOTRIN) 800 MG tablet Take 1 tablet (800 mg total) by mouth 3 (three) times daily. 12/10/14  Yes Danelle Berry, PA-C  naproxen (NAPROSYN) 500 MG tablet Take 1 tablet (500 mg total) by mouth 2 (two) times daily. 07/24/15    Vanetta Mulders, MD  traMADol (ULTRAM) 50 MG tablet Take 1 tablet (50 mg total) by mouth every 6 (six) hours as needed. Patient not taking: Reported on 07/24/2015 12/10/14   Danelle Berry, PA-C    Family History History reviewed. No pertinent family history.  Social History Social History   Tobacco Use   Smoking status: Never   Smokeless tobacco: Never  Substance Use Topics   Alcohol use: Yes   Drug use: Yes    Types: Marijuana     Allergies   Patient has no known allergies.   Review of Systems Review of Systems  Respiratory:  Positive for shortness of breath.   Per HPI   Physical Exam Triage Vital Signs ED Triage Vitals  Enc Vitals Group     BP 12/17/21 1800 126/78     Pulse Rate 12/17/21 1800 91     Resp 12/17/21 1800 (!) 22     Temp 12/17/21 1800 98.3 F (36.8 C)     Temp Source 12/17/21 1800 Oral     SpO2 12/17/21 1800 97 %     Weight --      Height --      Head Circumference --      Peak Flow --      Pain Score 12/17/21 1759 9     Pain Loc --      Pain Edu? --  Excl. in GC? --    No data found.  Updated Vital Signs BP 126/78 (BP Location: Right Arm)   Pulse 91   Temp 98.3 F (36.8 C) (Oral)   Resp (!) 22   SpO2 97%   Visual Acuity Right Eye Distance:   Left Eye Distance:   Bilateral Distance:    Right Eye Near:   Left Eye Near:    Bilateral Near:     Physical Exam Vitals and nursing note reviewed.  Constitutional:      Appearance: He is not ill-appearing or toxic-appearing.  HENT:     Head: Normocephalic and atraumatic.     Right Ear: Hearing and external ear normal.     Left Ear: Hearing and external ear normal.     Nose: Nose normal.     Mouth/Throat:     Lips: Pink.     Mouth: Mucous membranes are moist.     Pharynx: No posterior oropharyngeal erythema.  Eyes:     General: Lids are normal. Vision grossly intact. Gaze aligned appropriately.     Extraocular Movements: Extraocular movements intact.     Conjunctiva/sclera:  Conjunctivae normal.  Cardiovascular:     Rate and Rhythm: Normal rate and regular rhythm.     Heart sounds: Normal heart sounds, S1 normal and S2 normal.  Pulmonary:     Effort: Pulmonary effort is normal. No respiratory distress.     Breath sounds: Normal breath sounds and air entry.  Chest:       Comments: TTP to the area indicated above to the inferior left nipple.  No obvious deformity, swelling, laceration, mass, ecchymosis, or evidence of injury. Musculoskeletal:     Cervical back: Neck supple.  Skin:    General: Skin is warm and dry.     Capillary Refill: Capillary refill takes less than 2 seconds.     Findings: No rash.  Neurological:     General: No focal deficit present.     Mental Status: He is alert and oriented to person, place, and time. Mental status is at baseline.     Cranial Nerves: No dysarthria or facial asymmetry.  Psychiatric:        Mood and Affect: Mood normal.        Speech: Speech normal.        Behavior: Behavior normal.        Thought Content: Thought content normal.        Judgment: Judgment normal.      UC Treatments / Results  Labs (all labs ordered are listed, but only abnormal results are displayed) Labs Reviewed - No data to display  EKG   Radiology No results found.  Procedures Procedures (including critical care time)  Medications Ordered in UC Medications  ketorolac (TORADOL) 30 MG/ML injection 30 mg (30 mg Intramuscular Given 12/17/21 1918)    Initial Impression / Assessment and Plan / UC Course  I have reviewed the triage vital signs and the nursing notes.  Pertinent labs & imaging results that were available during my care of the patient were reviewed by me and considered in my medical decision making (see chart for details).   1.  Fall, rib pain Imaging negative for acute cardiopulmonary or musculoskeletal abnormality.  Patient given ketorolac injection in clinic to help with acute pain.  He may take ibuprofen with food  every 8 hours for the next 2 to 3 days, then as needed for pain and inflammation.  First dose of ibuprofen may  be tomorrow due to ketorolac injection in clinic today.  May also take Flexeril muscle relaxer every 12 hours as needed for muscle spasm pain to the chest wall.  Drowsiness precautions regarding Flexeril discussed.  Heat and gentle range of motion exercises recommended.  PCP/urgent care follow-up in the next 2 to 3 days should symptoms fail to improve.   Discussed physical exam and available lab work findings in clinic with patient.  Counseled patient regarding appropriate use of medications and potential side effects for all medications recommended or prescribed today. Discussed red flag signs and symptoms of worsening condition,when to call the PCP office, return to urgent care, and when to seek higher level of care in the emergency department. Patient verbalizes understanding and agreement with plan. All questions answered. Patient discharged in stable condition.    Final Clinical Impressions(s) / UC Diagnoses   Final diagnoses:  Fall, initial encounter  Rib pain     Discharge Instructions      You have been evaluated in the today for your rib pain. Your pain is most likely muscle strain which will improve on its own with time.   Take ibuprofen 800mg  (4 200mg  pills) with food every 8 hours for the next 2-3 days consistently to treat pain and inflammation, then as needed. Do not take any other NSAID containing medicine when taking ibuprofen like naproxen, Advil, Aleve, motrin, aspirin, Voltaren, diclofenac, and other medicines. Take this medication with a snack to avoid stomach upset.   Your first dose of ibuprofen when you get home may be tomorrow morning since you were given ketorolac injection in the clinic today.  You may also take Flexeril muscle relaxer.  Do not take this medication and drive or drink alcohol as it can make you sleepy.  Mainly use this medicine at nighttime as  needed.  Apply heat and perform gentle range of motion exercises to the area of greatest pain to prevent muscle stiffness and provide further pain relief.   Follow-up with your primary care provider or return to urgent care if your symptoms do not improve in the next 3 to 4 days with medications and interventions recommended today.  If you develop any new or worsening symptoms, please return to urgent care.  If your symptoms are severe, please go to the emergency room.  I hope you feel better!    ED Prescriptions     Medication Sig Dispense Auth. Provider   cyclobenzaprine (FLEXERIL) 10 MG tablet Take 1 tablet (10 mg total) by mouth 2 (two) times daily as needed for muscle spasms. 20 tablet , FNP      PDMP not reviewed this encounter.   Henderson, Reita May 12/20/21 [id number]

## 2021-12-17 NOTE — ED Triage Notes (Signed)
Patient fell 2-3 days ago. Started having SOB and Chest pain.  Patient fell while carrying a 44ft ladder. Landed on the ladder. Having pain in the left side of the chest.

## 2021-12-17 NOTE — Discharge Instructions (Addendum)
You have been evaluated in the today for your rib pain. Your pain is most likely muscle strain which will improve on its own with time.   Take ibuprofen 800mg  (4 200mg  pills) with food every 8 hours for the next 2-3 days consistently to treat pain and inflammation, then as needed. Do not take any other NSAID containing medicine when taking ibuprofen like naproxen, Advil, Aleve, motrin, aspirin, Voltaren, diclofenac, and other medicines. Take this medication with a snack to avoid stomach upset.   Your first dose of ibuprofen when you get home may be tomorrow morning since you were given ketorolac injection in the clinic today.  You may also take Flexeril muscle relaxer.  Do not take this medication and drive or drink alcohol as it can make you sleepy.  Mainly use this medicine at nighttime as needed.  Apply heat and perform gentle range of motion exercises to the area of greatest pain to prevent muscle stiffness and provide further pain relief.   Follow-up with your primary care provider or return to urgent care if your symptoms do not improve in the next 3 to 4 days with medications and interventions recommended today.  If you develop any new or worsening symptoms, please return to urgent care.  If your symptoms are severe, please go to the emergency room.  I hope you feel better!

## 2022-11-09 ENCOUNTER — Ambulatory Visit (HOSPITAL_COMMUNITY)
Admission: EM | Admit: 2022-11-09 | Discharge: 2022-11-09 | Disposition: A | Discharge status: Home / Self Care | Payer: Medicaid Other | Attending: Emergency Medicine | Admitting: Emergency Medicine

## 2022-11-09 ENCOUNTER — Encounter (HOSPITAL_COMMUNITY): Payer: Self-pay | Admitting: *Deleted

## 2022-11-09 DIAGNOSIS — M5441 Lumbago with sciatica, right side: Secondary | ICD-10-CM

## 2022-11-09 MED ORDER — DEXAMETHASONE SODIUM PHOSPHATE 10 MG/ML IJ SOLN
INTRAMUSCULAR | Status: AC
  Filled 2022-11-09: qty 1

## 2022-11-09 MED ORDER — METHOCARBAMOL 500 MG PO TABS: 500.0000 mg | ORAL_TABLET | Freq: Two times a day (BID) | ORAL | 0 refills | Status: DC

## 2022-11-09 MED ORDER — DEXAMETHASONE SODIUM PHOSPHATE 10 MG/ML IJ SOLN
10.0000 mg | Freq: Once | INTRAMUSCULAR | Status: AC
  Administered 2022-11-09: 10 mg via INTRAMUSCULAR

## 2022-11-09 MED ORDER — NAPROXEN 375 MG PO TABS: 375.0000 mg | ORAL_TABLET | Freq: Two times a day (BID) | ORAL | 0 refills | Status: AC

## 2022-11-09 NOTE — Discharge Instructions (Addendum)
We have given you a steroid injection to help with pain and inflammation.  Please take the naproxen twice daily with food to help with pain and inflammation as well.  To help with muscle spasms you can take the Robaxin up to 2 times daily, do not drink or drive on this medication as it may cause drowsiness.  It is important that you follow-up with your primary care provider regarding further evaluation of your continued back pain, as they may be able to refer you to physical therapy or occupational therapy as needed and do further intervention.  Return to clinic for any new or urgent symptoms.

## 2022-11-09 NOTE — ED Triage Notes (Signed)
Pt states he is having low back pain x 1 week. He has been taking IBU as needed

## 2022-11-09 NOTE — ED Provider Notes (Signed)
[facility]    CSN: [id number] Arrival date & time: 11/09/22  1432      History   Chief Complaint Chief Complaint  Patient presents with   Back Pain    HPI Dustin Coleman is a 47 y.o. male.   Patient presents to clinic for worsening right sided low back pain that started last Thursday.  He has been dealing with back pain on and off for his entire life.  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  He was bending down at work when he felt like his back spasmed and thinks he 'blew out his back.'   He has been taking ibuprofen, stretching and taking warm baths without much relief.  Reports the pain radiates down to the back of his right knee.  Denies any incontinence, trauma, falls or in her leg numbness.    The history is provided by the patient and medical records.  Back Pain Associated symptoms: no dysuria, no fever, no numbness and no weakness     History reviewed. No pertinent past medical history.  There are no problems to display for this patient.   History reviewed. No pertinent surgical history.     Home Medications    Prior to Admission medications   Medication Sig Start Date End Date Taking? Authorizing Provider  ibuprofen (ADVIL,MOTRIN) 800 MG tablet Take 1 tablet (800 mg total) by mouth 3 (three) times daily. 12/10/14  Yes Danelle Berry, PA-C  methocarbamol (ROBAXIN) 500 MG tablet Take 1 tablet (500 mg total) by mouth 2 (two) times daily. 11/09/22  Yes Rinaldo Ratel, [place], FNP  naproxen (NAPROSYN) 375 MG tablet Take 1 tablet (375 mg total) by mouth 2 (two) times daily. 11/09/22  Yes Ignatius Kloos, [place], FNP    Family History History reviewed. No pertinent family history.  Social History Social History   Tobacco Use   Smoking status: Never   Smokeless tobacco: Never  Vaping Use   Vaping status: Never Used  Substance Use Topics   Alcohol use: Not Currently   Drug use: Yes    Types: Marijuana      Allergies   Patient has no known allergies.   Review of Systems Review of Systems  Constitutional:  Negative for fever.  Genitourinary:  Negative for dysuria.  Musculoskeletal:  Positive for back pain. Negative for gait problem.  Neurological:  Negative for weakness and numbness.     Physical Exam Triage Vital Signs ED Triage Vitals  Encounter Vitals Group     BP 11/09/22 1507 120/70     Systolic BP Percentile --      Diastolic BP Percentile --      Pulse Rate 11/09/22 1507 70     Resp 11/09/22 1507 18     Temp 11/09/22 1507 97.8 F (36.6 C)     Temp Source 11/09/22 1507 Oral     SpO2 11/09/22 1507 97 %     Weight --      Height --      Head Circumference --      Peak Flow --      Pain Score 11/09/22 1505 8     Pain Loc --      Pain Education --      Exclude from Growth Chart --    No data found.  Updated Vital Signs BP 120/70 (BP Location: Right Arm)   Pulse 70   Temp 97.8 F (36.6 C) (  Oral)   Resp 18   SpO2 97%   Visual Acuity Right Eye Distance:   Left Eye Distance:   Bilateral Distance:    Right Eye Near:   Left Eye Near:    Bilateral Near:     Physical Exam Vitals and nursing note reviewed.  Constitutional:      Appearance: Normal appearance.  HENT:     Head: Normocephalic and atraumatic.     Right Ear: External ear normal.     Left Ear: External ear normal.     Nose: Nose normal.     Mouth/Throat:     Mouth: Mucous membranes are moist.  Eyes:     Conjunctiva/sclera: Conjunctivae normal.  Cardiovascular:     Rate and Rhythm: Normal rate.  Pulmonary:     Effort: Pulmonary effort is normal. No respiratory distress.  Musculoskeletal:        General: Tenderness present. No swelling, deformity or signs of injury.     Cervical back: Normal.     Thoracic back: Normal.     Lumbar back: Spasms present. Decreased range of motion.       Back:     Comments: Right sided low back pain that is TTP. Spine w/o step of for deformity.   Skin:     General: Skin is warm and dry.  Neurological:     General: No focal deficit present.     Mental Status: He is alert and oriented to person, place, and time.  Psychiatric:        Mood and Affect: Mood normal.        Behavior: Behavior normal. Behavior is cooperative.      UC Treatments / Results  Labs (all labs ordered are listed, but only abnormal results are displayed) Labs Reviewed - No data to display  EKG   Radiology No results found.  Procedures Procedures (including critical care time)  Medications Ordered in UC Medications  dexamethasone (DECADRON) injection 10 mg (10 mg Intramuscular Given 11/09/22 1533)    Initial Impression / Assessment and Plan / UC Course  I have reviewed the triage vital signs and the nursing notes.  Pertinent labs & imaging results that were available during my care of the patient were reviewed by me and considered in my medical decision making (see chart for details).  Vitals and triage reviewed, patient is hemodynamically stable.  Right-sided lumbar back pain that patient endorses tenderness with palpation.  Pain increases with bending and twisting, likely musculoskeletal.  Negative for inner leg numbness, incontinence or trauma.  Send concern for sciatica as pain does radiate down to the back of his right knee.  Given IM steroids, muscle relaxers and anti-inflammatories for pain and inflammation.  Patient reports he has an upcoming appointment with a primary care provider.  Plan of care, follow-up care and return precautions given, no questions at this time.     Final Clinical Impressions(s) / UC Diagnoses   Final diagnoses:  Acute right-sided low back pain with right-sided sciatica     Discharge Instructions      We have given you a steroid injection to help with pain and inflammation.  Please take the naproxen twice daily with food to help with pain and inflammation as well.  To help with muscle spasms you can take the Robaxin up  to 2 times daily, do not drink or drive on this medication as it may cause drowsiness.  It is important that you follow-up with your primary care provider  regarding further evaluation of your continued back pain, as they may be able to refer you to physical therapy or occupational therapy as needed and do further intervention.  Return to clinic for any new or urgent symptoms.      ED Prescriptions     Medication Sig Dispense Auth. Provider   methocarbamol (ROBAXIN) 500 MG tablet Take 1 tablet (500 mg total) by mouth 2 (two) times daily. 20 tablet Rinaldo Ratel, [place], [place]   naproxen (NAPROSYN) 375 MG tablet Take 1 tablet (375 mg total) by mouth 2 (two) times daily. 20 tablet Rolando Hessling, [place], [place]      PDMP not reviewed this encounter.   Chrystian Ressler, [place] N, [place] 11/09/22 [phone]

## 2022-12-08 ENCOUNTER — Other Ambulatory Visit: Payer: Self-pay

## 2022-12-08 ENCOUNTER — Emergency Department (HOSPITAL_COMMUNITY)
Admission: EM | Admit: 2022-12-08 | Discharge: 2022-12-09 | Disposition: A | Discharge status: Home / Self Care | Payer: Medicaid Other | Attending: Emergency Medicine | Admitting: Emergency Medicine

## 2022-12-08 ENCOUNTER — Encounter (HOSPITAL_COMMUNITY): Payer: Self-pay

## 2022-12-08 DIAGNOSIS — M5441 Lumbago with sciatica, right side: Secondary | ICD-10-CM | POA: Insufficient documentation

## 2022-12-08 DIAGNOSIS — M545 Low back pain, unspecified: Secondary | ICD-10-CM | POA: Diagnosis present

## 2022-12-08 NOTE — ED Triage Notes (Signed)
Pt arrived POV from home c/o lower back that has been ongoing for several weeks. Pt was seen by his PCP and urgent care with no relief and he cannot lay down or sleep because of the pain, denies any issues with incontinence. Pt is also requesting an xray and states degenerative disc disease runs in his family.

## 2022-12-09 MED ORDER — METHOCARBAMOL 500 MG PO TABS: 500.0000 mg | ORAL_TABLET | Freq: Two times a day (BID) | ORAL | 0 refills | Status: AC

## 2022-12-09 MED ORDER — KETOROLAC TROMETHAMINE 15 MG/ML IJ SOLN
30.0000 mg | Freq: Once | INTRAMUSCULAR | Status: AC
  Administered 2022-12-09: 30 mg via INTRAMUSCULAR
  Filled 2022-12-09: qty 2

## 2022-12-09 MED ORDER — GABAPENTIN 100 MG PO CAPS: 100.0000 mg | ORAL_CAPSULE | Freq: Three times a day (TID) | ORAL | 0 refills | Status: AC

## 2022-12-09 NOTE — ED Provider Notes (Signed)
[place] EMERGENCY DEPARTMENT AT [facility] Provider Note   CSN: [id number] Arrival date & time: 12/08/22  2323     History  Chief Complaint  Patient presents with   Back Pain    Dustin Coleman is a 47 y.o. male.  47 yo M with a chief complaints of right-sided low back pain.  This been going on for a couple months.  He denies any significant injury.  He said that he had been doing some heavy lifting prior to the events.  Since then his pain is gotten better and worse.  He works [employer] and when he has a hard day thinks it gets a bit worse.  Has seen his PCP as well as a chiropractor and urgent care.  He tried a few different medications without significant improvement.  He denies trauma denies loss of bowel or bladder denies loss of rectal sensation denies numbness or weakness to the leg.  Denies fever.   Back Pain      Home Medications Prior to Admission medications   Medication Sig Start Date End Date Taking? Authorizing Provider  gabapentin (NEURONTIN) 100 MG capsule Take 1 capsule (100 mg total) by mouth 3 (three) times daily. 12/09/22  Yes Melene Plan, DO  methocarbamol (ROBAXIN) 500 MG tablet Take 1 tablet (500 mg total) by mouth 2 (two) times daily. 12/09/22  Yes Melene Plan, DO  ibuprofen (ADVIL,MOTRIN) 800 MG tablet Take 1 tablet (800 mg total) by mouth 3 (three) times daily. 12/10/14   Danelle Berry, PA-C  naproxen (NAPROSYN) 375 MG tablet Take 1 tablet (375 mg total) by mouth 2 (two) times daily. 11/09/22   Garrison, [place] N, FNP      Allergies    Patient has no known allergies.    Review of Systems   Review of Systems  Musculoskeletal:  Positive for back pain.    Physical Exam Updated Vital Signs BP (!) 145/99   Pulse 64   Temp 98 F (36.7 C)   Resp 18   Ht 6\' 6"  (1.981 m)   Wt 121.1 kg   SpO2 100%   BMI 30.85 kg/m  Physical Exam Vitals and nursing note reviewed.  Constitutional:      Appearance: He is well-developed.  HENT:      Head: Normocephalic and atraumatic.  Eyes:     Pupils: Pupils are equal, round, and reactive to light.  Neck:     Vascular: No JVD.  Cardiovascular:     Rate and Rhythm: Normal rate and regular rhythm.     Heart sounds: No murmur heard.    No friction rub. No gallop.  Pulmonary:     Effort: No respiratory distress.     Breath sounds: No wheezing.  Abdominal:     General: There is no distension.     Tenderness: There is no abdominal tenderness. There is no guarding or rebound.  Musculoskeletal:        General: Normal range of motion.     Cervical back: Normal range of motion and neck supple.     Comments: Pulse motor and sensation intact to the right lower extremity.  No obvious midline spinal tenderness step-offs or deformities.  Reflexes are 2+ and equal.  No clonus.  Skin:    Coloration: Skin is not pale.     Findings: No rash.  Neurological:     Mental Status: He is alert and oriented to person, place, and time.  Psychiatric:  Behavior: Behavior normal.     ED Results / Procedures / Treatments   Labs (all labs ordered are listed, but only abnormal results are displayed) Labs Reviewed - No data to display  EKG None  Radiology No results found.  Procedures Procedures    Medications Ordered in ED Medications  ketorolac (TORADOL) 15 MG/ML injection 30 mg (30 mg Intramuscular Given 12/09/22 0129)    ED Course/ Medical Decision Making/ A&P                                 Medical Decision Making Risk Prescription drug management.   47 yo M with a chief complaints of right-sided low back pain that radiates down the leg.  Sounds musculoskeletal by history and physical.  No red flags.  Atraumatic.  Will continue supportive care.  Have him follow-up with his family doctor in the office.  2:16 AM:  I have discussed the diagnosis/risks/treatment options with the patient.  Evaluation and diagnostic testing in the emergency department does not suggest an emergent  condition requiring admission or immediate intervention beyond what has been performed at this time.  They will follow up with PCP. We also discussed returning to the ED immediately if new or worsening sx occur. We discussed the sx which are most concerning (e.g., sudden worsening pain, fever, inability to tolerate by mouth, cauda equina s/sx) that necessitate immediate return. Medications administered to the patient during their visit and any new prescriptions provided to the patient are listed below.  Medications given during this visit Medications  ketorolac (TORADOL) 15 MG/ML injection 30 mg (30 mg Intramuscular Given 12/09/22 0129)     The patient appears reasonably screen and/or stabilized for discharge and I doubt any other medical condition or other [facility] requiring further screening, evaluation, or treatment in the ED at this time prior to discharge.          Final Clinical Impression(s) / ED Diagnoses Final diagnoses:  Acute right-sided low back pain with right-sided sciatica    Rx / DC Orders ED Discharge Orders          Ordered    methocarbamol (ROBAXIN) 500 MG tablet  2 times daily        12/09/22 0214    gabapentin (NEURONTIN) 100 MG capsule  3 times daily        12/09/22 0215              Melene Plan, DO 12/09/22 0216

## 2022-12-09 NOTE — Discharge Instructions (Signed)
Your back pain is most likely due to a muscular strain.  There is been a lot of research on back pain, unfortunately the only thing that seems to really help is Tylenol and ibuprofen.  Relative rest is also important to not lift greater than 10 pounds bending or twisting at the waist.  Please follow-up with your family physician.  The other thing that really seems to benefit patients is physical therapy which your doctor may send you for.  Please return to the emergency department for new numbness or weakness to your arms or legs. Difficulty with urinating or urinating or pooping on yourself.  Also if you cannot feel toilet paper when you wipe or get a fever.   Take 4 over the counter ibuprofen tablets 3 times a day or 2 over-the-counter naproxen tablets twice a day for pain. Also take tylenol 1000mg(2 extra strength) four times a day.   

## 2022-12-09 NOTE — ED Notes (Signed)
Pt sent to triage for pain medicine due to worsening back pain. Triage RN notified.
